# Patient Record
Sex: Male | Born: 2000 | Race: Black or African American | Hispanic: No | Marital: Single | Smoking: Never smoker
Health system: Southern US, Community
[De-identification: ages and names within clinical notes are randomized; demographics above are authoritative.]

---

## 2020-06-05 ENCOUNTER — Encounter (HOSPITAL_COMMUNITY): Payer: Self-pay | Admitting: Emergency Medicine

## 2020-06-05 ENCOUNTER — Emergency Department (HOSPITAL_COMMUNITY): Payer: Medicaid Other

## 2020-06-05 ENCOUNTER — Other Ambulatory Visit: Payer: Self-pay

## 2020-06-05 ENCOUNTER — Emergency Department (HOSPITAL_COMMUNITY)
Admission: EM | Admit: 2020-06-05 | Discharge: 2020-06-06 | Disposition: A | Discharge status: Home / Self Care | Payer: Medicaid Other | Attending: Emergency Medicine | Admitting: Emergency Medicine

## 2020-06-05 DIAGNOSIS — S199XXA Unspecified injury of neck, initial encounter: Secondary | ICD-10-CM | POA: Diagnosis present

## 2020-06-05 DIAGNOSIS — S12101A Unspecified nondisplaced fracture of second cervical vertebra, initial encounter for closed fracture: Secondary | ICD-10-CM

## 2020-06-05 DIAGNOSIS — S12100A Unspecified displaced fracture of second cervical vertebra, initial encounter for closed fracture: Secondary | ICD-10-CM | POA: Diagnosis not present
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
## 2020-06-06 ENCOUNTER — Emergency Department (HOSPITAL_COMMUNITY): Payer: Medicaid Other

## 2020-06-06 DIAGNOSIS — S12100A Unspecified displaced fracture of second cervical vertebra, initial encounter for closed fracture: Secondary | ICD-10-CM | POA: Diagnosis not present

## 2020-06-06 LAB — COMPREHENSIVE METABOLIC PANEL
ALT: 21 U/L (ref 0–44)
AST: 37 U/L (ref 15–41)
Albumin: 3.8 g/dL (ref 3.5–5.0)
Alkaline Phosphatase: 61 U/L (ref 38–126)
Anion gap: 7 (ref 5–15)
BUN: 10 mg/dL (ref 6–20)
CO2: 30 mmol/L (ref 22–32)
Calcium: 8.8 mg/dL — ABNORMAL LOW (ref 8.9–10.3)
Chloride: 103 mmol/L (ref 98–111)
Creatinine, Ser: 1.15 mg/dL (ref 0.61–1.24)
GFR, Estimated: >60 mL/min (ref >60)
Glucose, Bld: 100 mg/dL — ABNORMAL HIGH (ref 70–99)
Potassium: 4.4 mmol/L (ref 3.5–5.1)
Sodium: 140 mmol/L (ref 135–145)
Total Bilirubin: 0.9 mg/dL (ref 0.3–1.2)
Total Protein: 6.3 g/dL — ABNORMAL LOW (ref 6.5–8.1)

## 2020-06-06 LAB — CBC WITH DIFFERENTIAL/PLATELET
Abs Immature Granulocytes: 0.02 10*3/uL (ref 0.00–0.07)
Basophils Absolute: 0 10*3/uL (ref 0.0–0.1)
Basophils Relative: 1 %
Eosinophils Absolute: 0.4 10*3/uL (ref 0.0–0.5)
Eosinophils Relative: 6 %
HCT: 45.3 % (ref 39.0–52.0)
Hemoglobin: 14.5 g/dL (ref 13.0–17.0)
Immature Granulocytes: 0 %
Lymphocytes Relative: 25 %
Lymphs Abs: 1.6 10*3/uL (ref 0.7–4.0)
MCH: 30.1 pg (ref 26.0–34.0)
MCHC: 32 g/dL (ref 30.0–36.0)
MCV: 94 fL (ref 80.0–100.0)
Monocytes Absolute: 0.7 10*3/uL (ref 0.1–1.0)
Monocytes Relative: 11 %
Neutro Abs: 3.6 10*3/uL (ref 1.7–7.7)
Neutrophils Relative %: 57 %
Platelets: 239 10*3/uL (ref 150–400)
RBC: 4.82 MIL/uL (ref 4.22–5.81)
RDW: 12.7 % (ref 11.5–15.5)
WBC: 6.4 10*3/uL (ref 4.0–10.5)
nRBC: 0 % (ref 0.0–0.2)

## 2020-06-06 MED ORDER — OXYCODONE-ACETAMINOPHEN 5-325 MG PO TABS: 2.0000 | ORAL_TABLET | ORAL | 0 refills | Status: AC | PRN

## 2020-06-06 MED ORDER — ACETAMINOPHEN 325 MG PO TABS
650.0000 mg | ORAL_TABLET | Freq: Once | ORAL | Status: AC
  Administered 2020-06-06: 650 mg via ORAL
  Filled 2020-06-06: qty 2

## 2020-06-06 MED ORDER — METHOCARBAMOL 500 MG PO TABS: 500.0000 mg | ORAL_TABLET | Freq: Two times a day (BID) | ORAL | 0 refills | Status: AC

## 2020-06-06 MED ORDER — OXYCODONE-ACETAMINOPHEN 5-325 MG PO TABS
1.0000 | ORAL_TABLET | Freq: Once | ORAL | Status: AC
  Administered 2020-06-06: 1 via ORAL
  Filled 2020-06-06: qty 1

## 2020-06-06 MED ORDER — MORPHINE SULFATE (PF) 4 MG/ML IV SOLN: 4.0000 mg | Freq: Once | INTRAVENOUS | Status: DC
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
## 2020-06-06 NOTE — Discharge Instructions (Addendum)
Please call Dr. Mattie Marlin office to schedule an appointment in 2 weeks. Wear the neck brace/collar 23hours per day (may remove to shower).   Return to ED for any new or concerning problems such as sudden weakness in one or both hands or any severe numbness or other symptoms.   Please use Tylenol or ibuprofen for pain.  You may use 600 mg ibuprofen every 6 hours or 1000 mg of Tylenol every 6 hours.  You may choose to alternate between the 2.  This would be most effective.  Not to exceed 4 g of Tylenol within 24 hours.  Not to exceed 3200 mg ibuprofen 24 hours. Use musclerelaxer robaxin as well.

## 2020-06-06 NOTE — ED Notes (Signed)
Patient outside with mother
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
